# Patient Record
Sex: Male | Born: 1935 | Race: White | Hispanic: No | Marital: Single
Health system: Southern US, Community
[De-identification: ages and names within clinical notes are randomized; demographics above are authoritative.]

---

## 2004-04-05 ENCOUNTER — Emergency Department (HOSPITAL_COMMUNITY): Admission: EM | Admit: 2004-04-05 | Discharge: 2004-04-05 | Payer: Self-pay | Admitting: Emergency Medicine

## 2007-05-24 ENCOUNTER — Encounter (INDEPENDENT_AMBULATORY_CARE_PROVIDER_SITE_OTHER): Payer: Self-pay | Admitting: Orthopedic Surgery

## 2007-05-24 ENCOUNTER — Inpatient Hospital Stay (HOSPITAL_COMMUNITY): Admission: EM | Admit: 2007-05-24 | Discharge: 2007-05-30 | Payer: Self-pay | Admitting: Emergency Medicine

## 2007-05-27 ENCOUNTER — Ambulatory Visit: Payer: Self-pay | Admitting: Physical Medicine & Rehabilitation

## 2008-07-03 IMAGING — CR DG CHEST 1V
1 series · 1 of 1 positions shown · non-contrast
Comparison: none

CLINICAL DATA: Fall.  Fracture.
CHEST ? 1 VIEW:

[view not recorded]
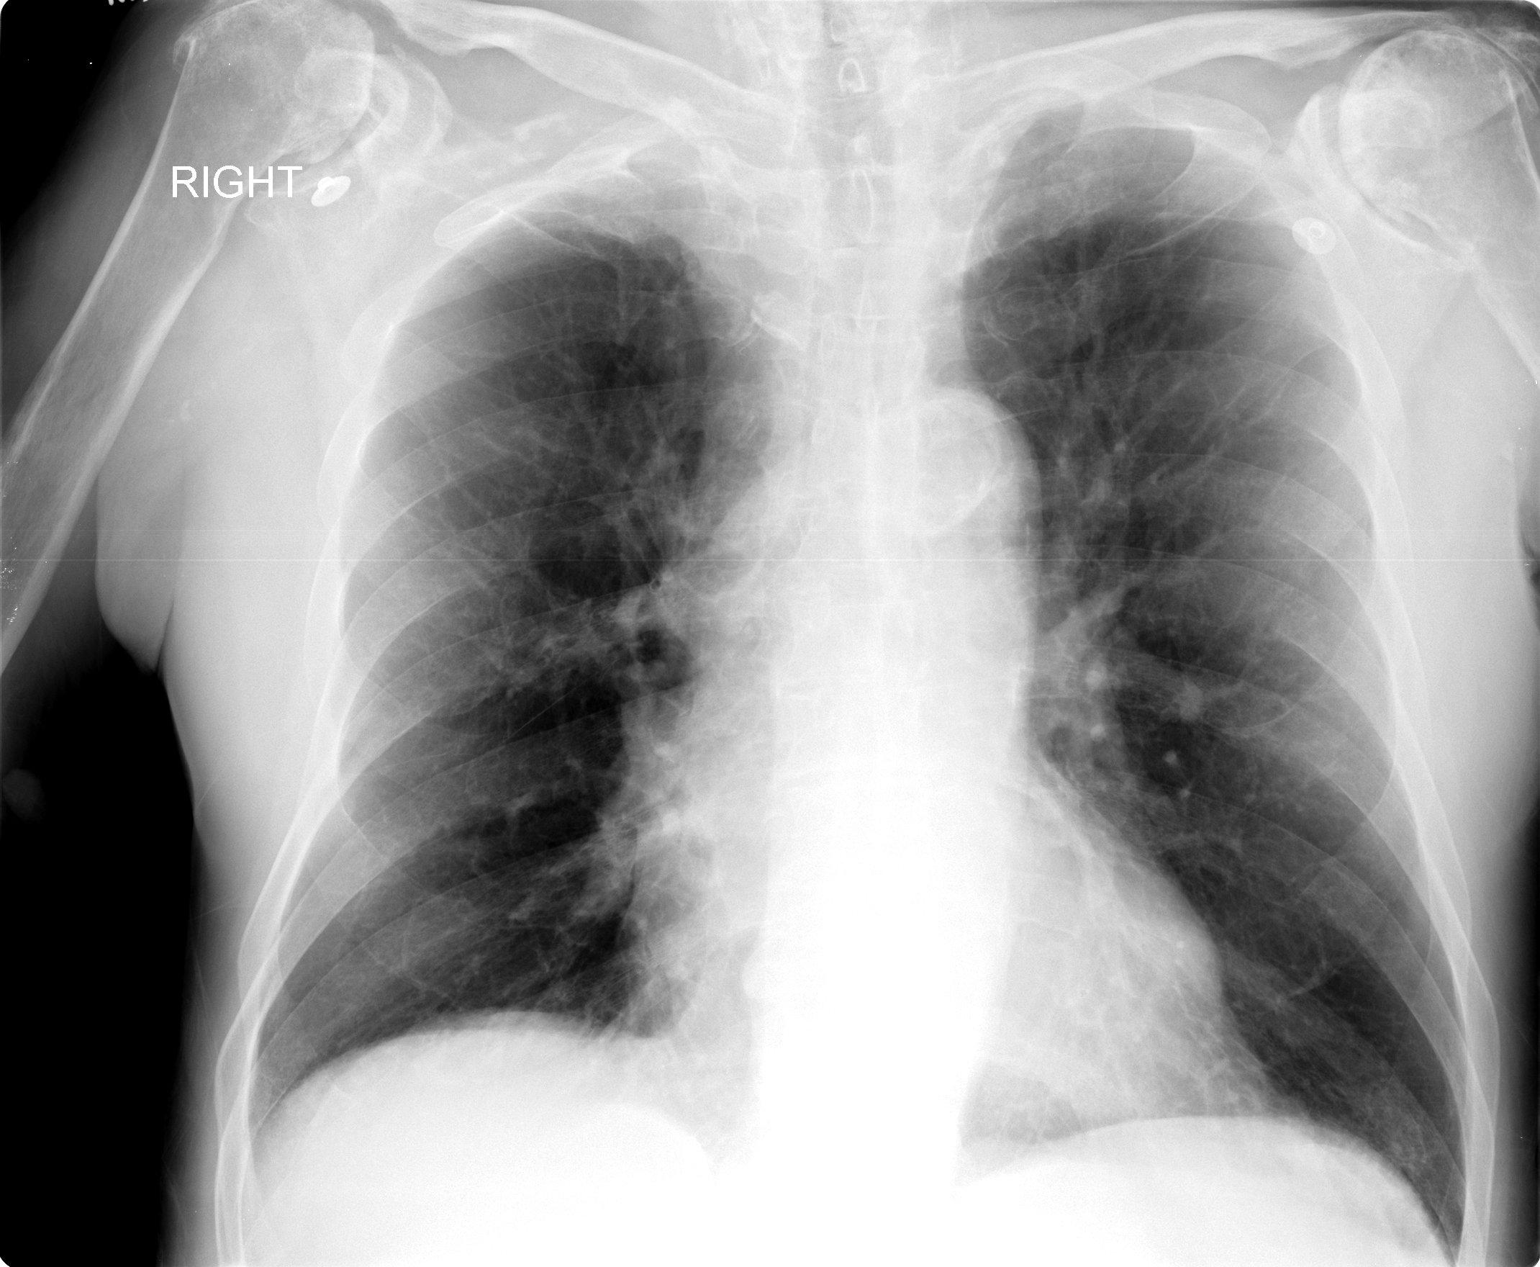

[1 of 1 positions shown; findings below may reference images not displayed]

FINDINGS: The chest is hyperexpanded but clear.  No effusion.  Mild cardiomegaly.  Severe degenerative disease about the shoulder is noted.
IMPRESSION: No acute disease with mild cardiomegaly noted.  
LEFT FEMUR - 2 VIEW:
FINDINGS: There is a periprosthetic fracture about the distal aspect of the femoral stem for right total hip replacement.  One shaft width medial displacement noted.  There is fragment override of approximately 3 cm.
IMPRESSION: Periprosthetic fracture as described.

## 2008-07-04 IMAGING — CR DG FEMUR 2V*L*
4 series · 4 of 4 positions shown · non-contrast
Comparison: 05/23/07.

CLINICAL DATA: Fracture fixation.
LEFT FEMUR ? 2 VIEW:

[view not recorded (1 of 4)]
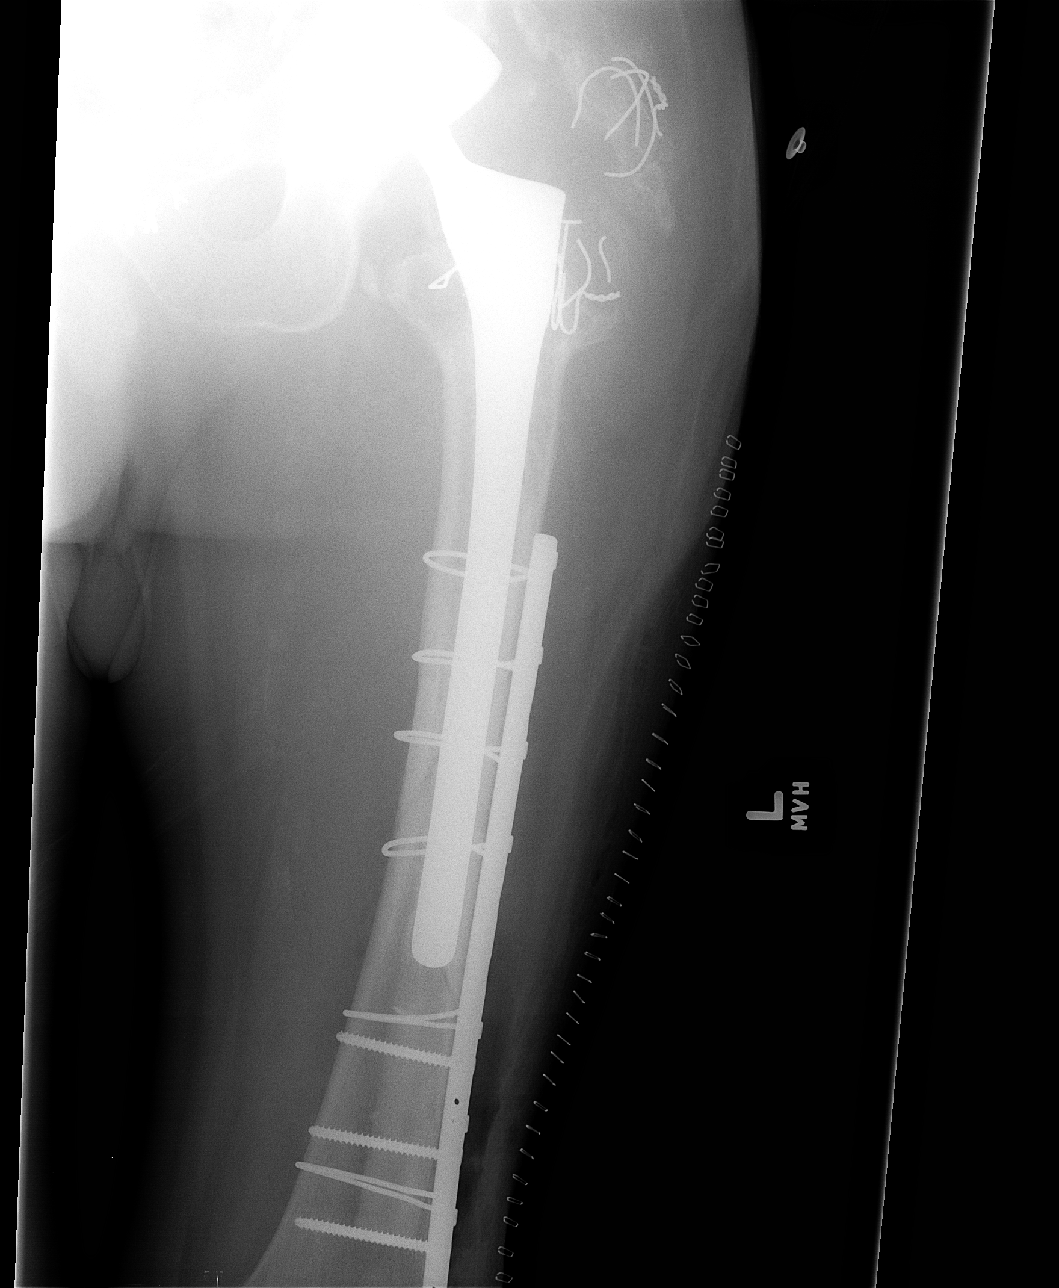

[view not recorded (2 of 4)]
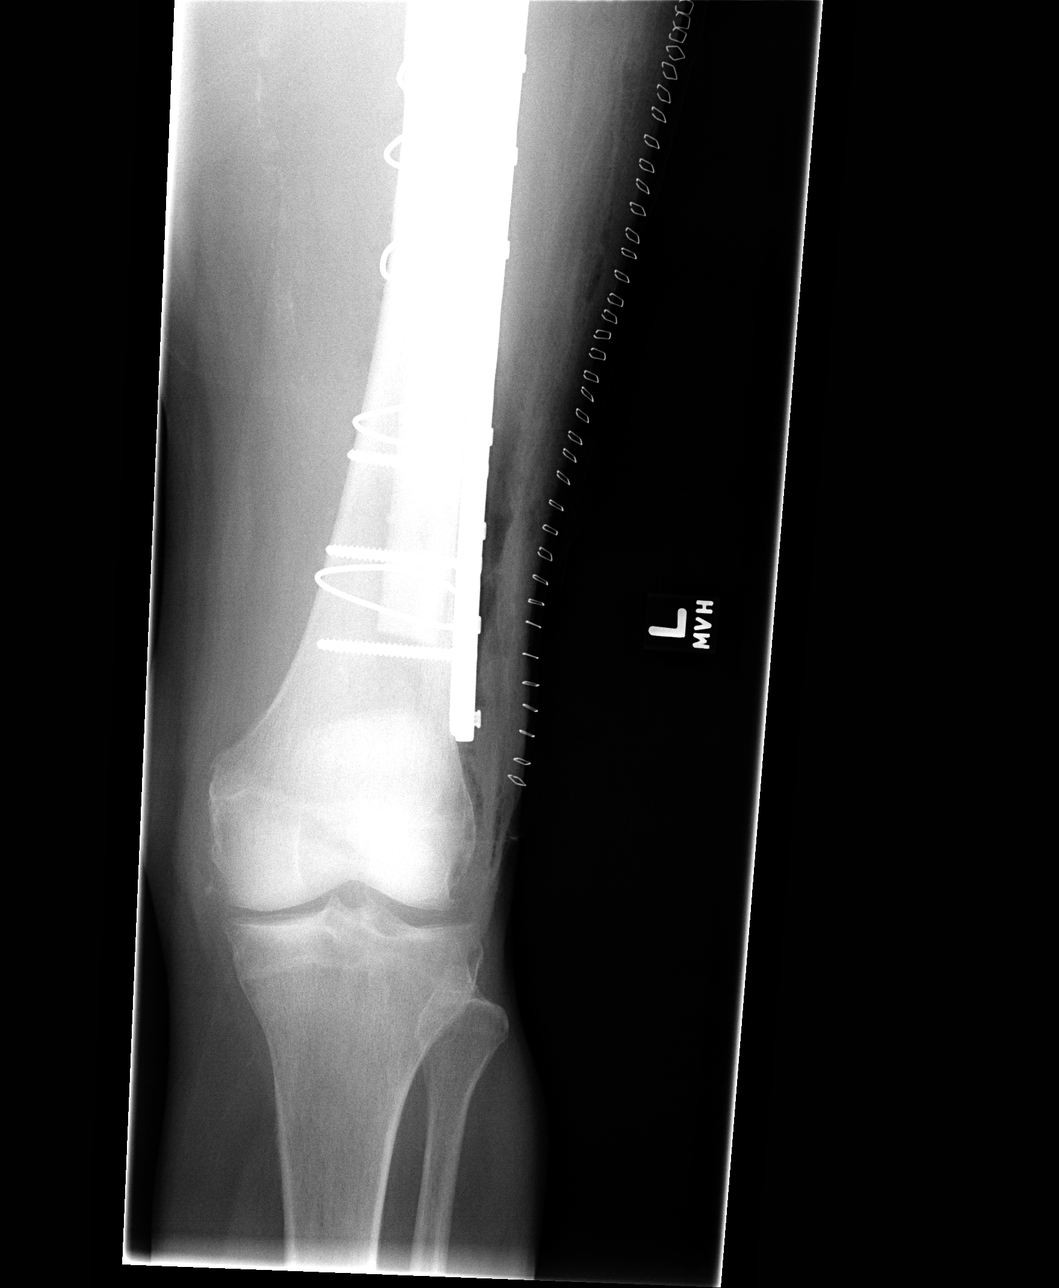

[view not recorded (3 of 4)]
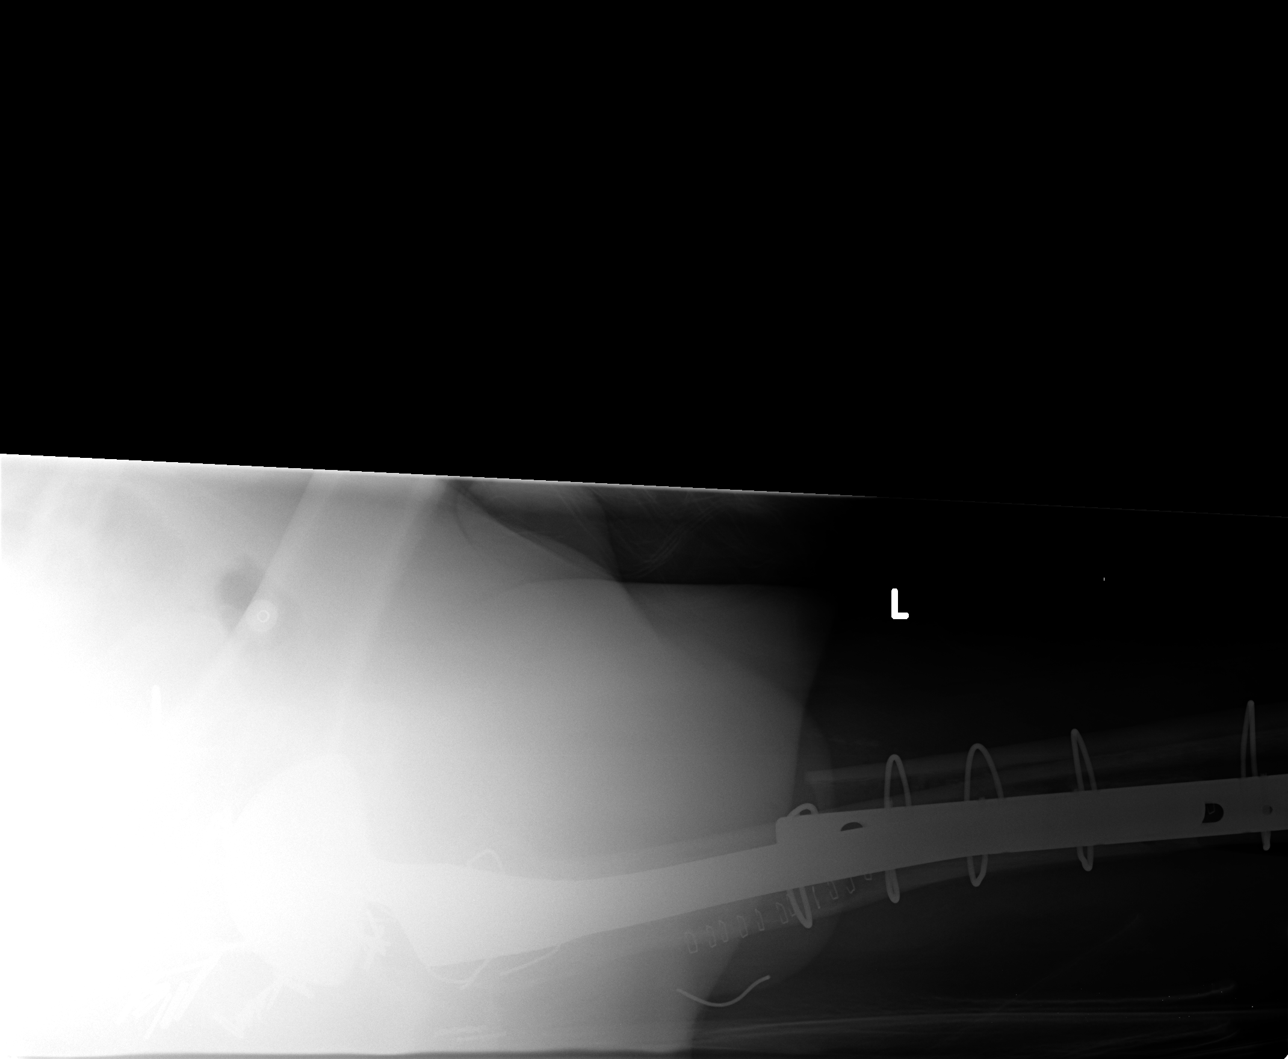

[view not recorded (4 of 4)]
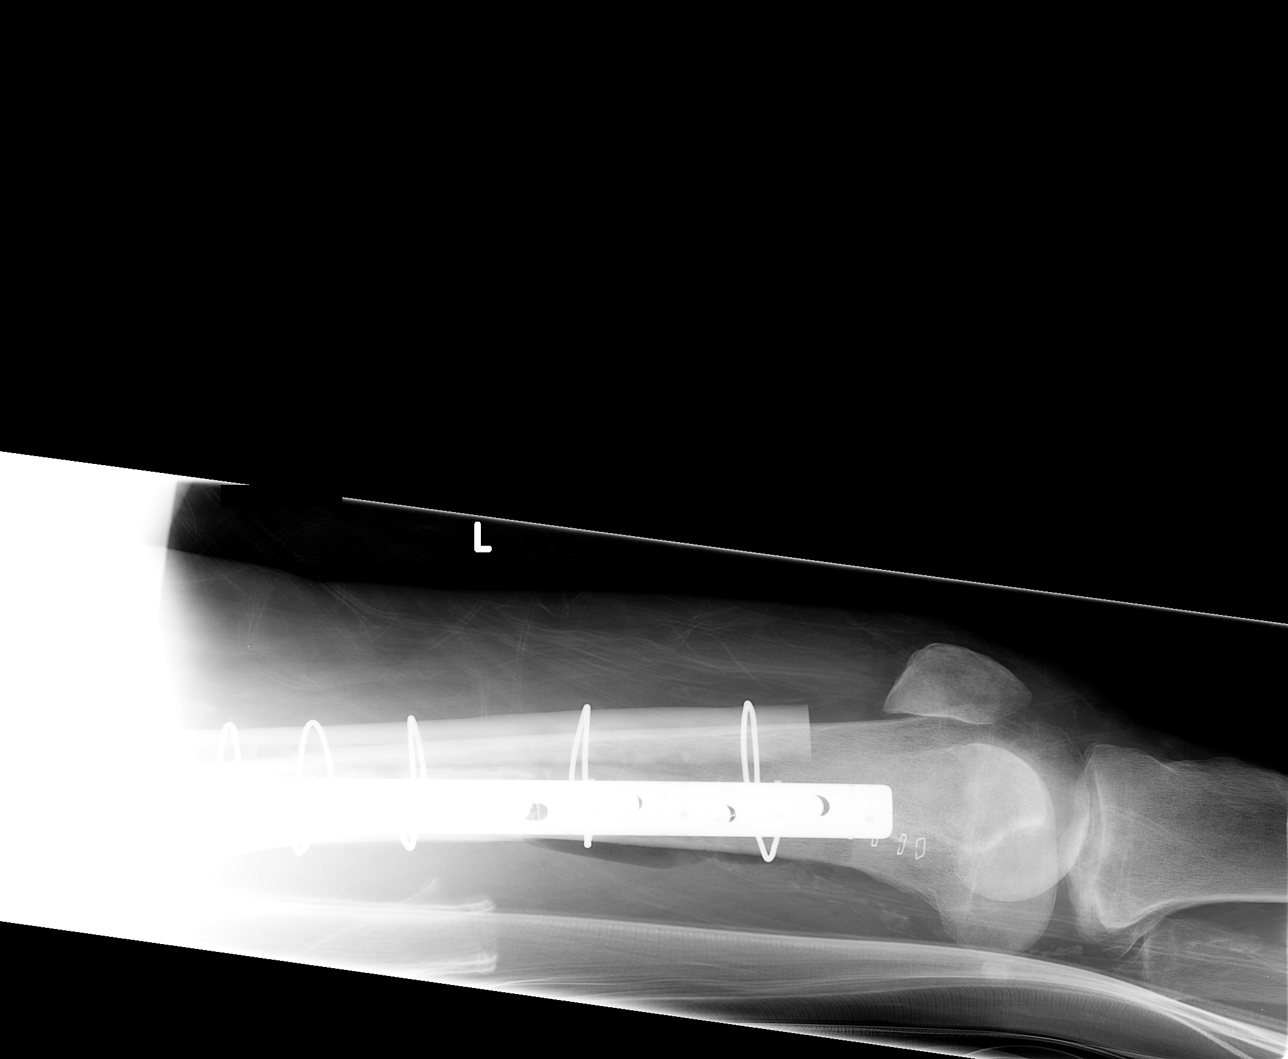

[4 of 4 positions shown; findings below may reference images not displayed]

FINDINGS: The patient has new plate and screws with Cerclage wires fixing a periprosthetic fracture in the left femur. Position and alignment are anatomic. Soft tissue gas is noted. Surgical staples are in place. No new abnormality.
IMPRESSION: Anatomic reduction of left periprosthetic femur fracture.

## 2010-10-14 NOTE — Op Note (Signed)
NAME:  Drew Kennedy, Drew Kennedy               ACCOUNT NO.:  [id number]   MEDICAL RECORD NO.:  [id number]          PATIENT TYPE:  INP   LOCATION:  1620                         FACILITY:  [facility]   PHYSICIAN:  Madlyn Frankel. Charlann Boxer, M.D.  DATE OF BIRTH:  [birth year]   DATE OF PROCEDURE:  05/24/2007  DATE OF DISCHARGE:                               OPERATIVE REPORT   PREOPERATIVE DIAGNOSIS:  Left periprosthetic femur fracture, closed.   POSTOPERATIVE DIAGNOSIS:  Left periprosthetic femur fracture, closed.   PROCEDURE:  Open reduction internal fixation of left periprosthetic  femur fracture utilizing allograft and a Zimmer 8-hole cable plate  system.   SURGEON:  Madlyn Frankel. Charlann Boxer, MD   ASSISTANT:  Yetta Glassman. Loreta Ave, PA   ANESTHESIA:  General.   BLOOD LOSS:  300.   COMPLICATIONS:  None.   DRAINS:  None.   INDICATIONS FOR PROCEDURE:  Mr. Mazon is a very pleasant 77 year old  gentleman here from [place] visiting his son when he missed a step on  stairs falling onto his left side.  He had immediate onset of pain,  inability to bear weight.  He was brought to [facility] through EMS,  initially admitted to Dr. Beverely Low and I was asked to help with  definitive management.  Risks and benefits of the procedure were  discussed with he and his family including the options available.  Based  on the spiral nature was fracture pattern I felt that open reduction  internal fixation would be best and this would probably classify as a  type B fracture.  Consent was obtained.   PROCEDURE IN DETAIL:  The patient was brought to the operative theater.  Once adequate anesthesia preoperative antibiotics, Ancef, administered  the patient was positioned in the right lateral decubitus position with  the left side up.  Left lower extremity was prepped and draped in a  sterile fashion.  I basically made an extensile approach to the femur at  this point, extending portions from his previous incision of the hip  down to  the knee.  The iliotibial band was opened up and the vastus  lateralis was elevated over the anterior aspect of the femur for  posterior intermuscular septum.  With the femur now exposing the  fracture identified there was noted to be muscle entrapment of the  fracture site and this was cleared.  Once the fracture was identified I  was able to reduce it into an anatomic position and hold it in place  with a clamp.  I then determined that it would be best fixed with an 8-  hole plate and a femoral allograft that we had previously chosen.  The  Zimmer 8-hole plate was opened and then positioned onto the lateral  aspect of the femur.  The allograft, which had thawed in saline, was  then opened and debrided of its sharp edges to get more of a flat  contoured service with a sharp bur.  While this was being prepared, I  did pass initially performed 4 wires, 2 proximal, 2 distal.   At this point I positioned the plate  in the final position with the  fracture reduced anatomically in addition to the now femoral strut which  matched almost in length probably 160 mm of length on the anterior  aspect of the femur.   With the fracture reduced I placed at this point 4 bicortical screws in  the distal femur.  I then placed 2 of my cables that had been pre-placed  around the strut allograft in the proximal aspect of fracture across the  prosthesis and into the plate and tensioned these down and held them in  place.  At this point with everything maintained I went ahead and  positioned 2 cables distally and 4 cables proximally, 3 of which were  around the strut allograft and plate and the fourth was just the most  proximal, was just around the femur itself.  With all the cables  tensioned to approximately 80 to 90 pounds of pressure and the screws  tightened down appropriately, the construct was complete, femur moved  without any signs of any excessive motion.  At this point I irrigated  the wound, that  was done throughout the case.  I reapproximated the  iliotibial band over the vastus fascia with the vastus lateralis over  top of the plate.  This was done in several running sutures.   The remainder of the wound was closed with 2-0 Vicryl and then staples  on the skin.  Skin was cleaned, dried and dressed sterilely with a bulky  sterile dressing.  He was brought to recovery room extubated and in  stable condition.      Madlyn Frankel Charlann Boxer, M.D.  Electronically Signed     MDO/MEDQ  D:  05/27/2007  T:  05/27/2007  Job:  [id number]

## 2010-10-14 NOTE — Op Note (Signed)
NAME:  Drew Kennedy, Drew Kennedy NO.:  [id number]   MEDICAL RECORD NO.:  [id number]          PATIENT TYPE:  INP   LOCATION:  1620                         FACILITY:  [facility]   PHYSICIAN:  Altha Harm, MDDATE OF BIRTH:  1918/09/28   DATE OF PROCEDURE:  05/24/2007  DATE OF DISCHARGE:                               OPERATIVE REPORT   REASON FOR CONSULTATION:  Preoperative evaluation for noncardiac  surgery.   HISTORY OF PRESENT ILLNESS:  This gentleman is an 77 year old gentleman  who recently drove from [place] to spend the Christmas holidays with his  son.  He states that while walking up the stairs he missed a step and  fell and sustained a fracture to the left hip.  The patient states that  he had no dizziness, loss of consciousness, seizure activity, or no  syncope prior to the fall.  Essentially the fall resulted from tripping  over a step.  The patient does have a history of hypertension and states  that it has been well controlled.  The patient is active at a community  level and states that he walks usually about approximately 3/4 to 1  block on a daily basis with the use of a cane.  The patient has no  significant cardiac history except for hypertension.  He states he has  not had a cardiac cath or a stress test in the past.  The patient denies  any chest pain at this time or in the past.  He states that he has  recently been fully evaluated by his medical doctors in [place] at the  [facility] and was found to be in good health.  The  patient has had no fever or chills.  He has had no nausea, vomiting, or  diarrhea.   PAST MEDICAL HISTORY:  Significant for hypertension.  The patient has a  history of prostate cancer in the past, approximately 22 years ago,  status post prostatectomy.  The patient currently is not undergoing any  treatment for his prostate cancer.   FAMILY HISTORY:  Though remarkable for hypertension, it has been  significant in  a patient of this age.   SOCIAL HISTORY:  The patient lives alone and functions on a community  level.  He denies any tobacco use.  He drinks alcohol socially and there  is no drug use involved.  The patient has no significant family where he  lives in [place].  His son resides in [place] in [place], who  he is visiting at this time.  The patient's son has expressed that he  feels that his home is not suitable for the postoperative rehabilitation  of the patient and both the patient and son are requesting consideration  for skilled placement, post surgical for rehabilitation.  The patient  eventually was able to drive himself back to [place].   CURRENT MEDICATIONS:  Include lisinopril 40 mg p.o. daily.   ALLERGIES:  NO KNOWN DRUG ALLERGIES.   REVIEW OF SYSTEMS:  Twelve systems are reviewed.  All systems are  negative except as noted in the HPI.  PHYSICAL EXAMINATION:  The patient is laying in bed, alert and oriented  x3.  In no acute distress.  His son is by the bedside.  He is a well-  nourished, well-developed gentleman who appears robust and not frail in  appearance.  Temperature 98.2.  Heart rate 76.  Respiratory rate 16.  Blood pressure 157/62.  O2 sats 100% on room air.  HEENT:  He is normocephalic, atraumatic.  Pupils are equally round and  reactive to light and accommodation.  Extraocular movements are intact.  There is no icterus or subconjunctival pallor.  Oropharynx is moist.  No  exudate, erythema, lesions are noted.  NECK:  Trachea is midline.  No masses, no thyromegaly.  No JVD, no  carotid bruit.  LUNGS:  Clear to auscultation.  No wheezing or rhonchi noted.  CARDIOVASCULAR:  He has got a normal S1 and S2.  There is a 2/6 systolic  ejection measurement at the base without radiation.  No heaves or  thrills on palpation.  ABDOMEN:  Obese, soft, nontender, nondistended.  No hepatosplenomegaly  noted.  LYMPH NODE:  There is no cervical, axillary, or inguinal  lymph nodes  noted.  MUSCULOSKELETAL:  The patient has left lower extremity that is somewhat  externally rotated and shortened.  Otherwise she has got good range of  motion in the right lower extremity and bilateral upper extremities.  There is no warmth, swelling, or erythema around the joints noted.  The  patient has no spinal tenderness.  He has a mildly kyphotic posture  without any apparent respiratory compromise.  NEUROLOGIC:  Cranial nerves 2-12 are grossly intact.  There is no focal  neurological deficit.  DTRs are 2+ bilaterally, upper and lower  extremities.  Strength is 4+/5 in bilateral upper extremities, 4/5 in  the right lower extremity.  Left lower extremity strength is not tested  secondary to fracture.  Please note that the patient has good pedal and  dorsalis pedis pulses.   LABORATORY STUDIES:  White blood cell count 8.5, hemoglobin 11.1,  hematocrit 32.6, platelet count 251.  Sodium 130, and potassium 3.8.  Chloride 106.  Bicarb 25.  BUN 20.  Creatinine 0.97.  INR 1.0.  Protime  13.3.  PTT 31.   ASSESSMENT AND PLAN:  This is a patient who is in genuinely good health  and presents with:  1. Left hip fracture.  2. Mild anemia.  3. Systolic ejection murmur.   The patient, given his history and level of functioning, is a low risk  for perioperative complications.  The only confounding factor is the  murmur heard on examination, which should be evaluated . It certainly  appears to be a murmur consistent with sclerosis.  However, a 2D  echocardiogram will be ordered to rule out stenosis.  None of this  should prevent the patient from proceeding with his surgery and we will  follow along with this.  In terms of the anemia, the patient likely has  a chronic anemia.  Unless the patient has a significant drop in  hemoglobin, at this time I see no reason to perform transfusion.  The  patient is here on an acute basis and has a medical home.  He states  that he has had  workup for his anemia and I will defer that to his  primary care physician as acutely the patient has no urgent need for a  workup.      Altha Harm, MD  Electronically Signed  MAM/MEDQ  D:  05/25/2007  T:  05/25/2007  Job:  [id number]

## 2010-10-14 NOTE — Discharge Summary (Signed)
NAME:  HAYZE, GAZDA               ACCOUNT NO.:  [id number]   MEDICAL RECORD NO.:  [id number]          PATIENT TYPE:  INP   LOCATION:  1620                         FACILITY:  [facility]   PHYSICIAN:  Madlyn Frankel. Charlann Boxer, M.D.  DATE OF BIRTH:  06-20-1918   DATE OF ADMISSION:  05/23/2007  DATE OF DISCHARGE:  05/30/2007                               DISCHARGE SUMMARY   ADMITTING DIAGNOSES:  1. Osteopenia with history of left total hip replacement in 1981.  2. Hypertension.  3. Prostate cancer.  4. Rheumatic fever in childhood.  5. History of right total knee replacement in 2007.   DISCHARGE DIAGNOSES:  1. Osteopenia with periprosthetic fracture.  2. Hypertension.  3. Prostate cancer.  4. Rheumatic fever as a child.  5. History of right total knee replacement.   CONSULTANTS:  Rehabilitation for in hospital rehabilitation, [facility]  team.   HISTORY OF PRESENT ILLNESS:  An 77 year old male who fell on the 21st.  Was transported to emergency department where evaluation revealed by x-  ray a left leg periprosthetic fracture.  Denies any chest pain,  shortness of breath, syncope prior to follow-up.   LABORATORIES ON ADMISSION:  Hematocrit 32.6.  CMET normal.  INR 1.  At  time of discharge hematocrit 31.8, CMET all within normal limits, no  significant trends.   RADIOLOGY:  Chest one view:  No acute disease with mild cardiomegaly.  Left femur showed periprosthetic fracture.  Left femur  postoperatively  showed reduction of left periprosthetic femur.   Cardiology EKG:  Normal sinus rhythm.   HOSPITAL COURSE:  The patient admitted through the emergency department  to our orthopedic service.  Care was transferred from  Dr. Ranell Patrick to Dr. Charlann Boxer for definitive surgical management.  He was  cleared medically for surgery on the 22nd by [facility] team.  He was  cleared medically for surgery on the 23rd.  He underwent open reduction.  left periprosthetic femur fracture with allograft placement on  the 23rd.  He tolerated procedure well, had no complications postoperatively.  He  was afebrile throughout his course of stay.  His dressing was changed on  a daily basis with no significant wound drainage.  He was  neurovascularly intact to his left lower extremity.  PT, OT was started  postop day #1 with touchdown weightbearing with no twisting or pivoting.  DVT prophylaxis Lovenox was started on postop day #1.  We did request a  [facility] rehabilitation consult; lack of medical necessity for [facility]  rehabilitation.  Did well with physical therapy; medically remained  stable.  Was able to ambulate 32 feet with physical therapy.  On the  29th he was stable and ready for discharge.   DISCHARGE DISPOSITION:  Stable and improved condition.   DISCHARGE PHYSICAL THERAPY:  He is touchdown weightbearing only for six  weeks.  He will require the use of a rolling walker.  Want to continue  to work on balance, gait training, proprioception.   DISCHARGE DIET:  Regular as tolerated by the patient.   DISCHARGE MEDICATIONS:  1. Lisinopril 40 mg p.o. daily.  2. Lipitor 40  mg, has not been taking.  3. Vitamins A, B, C, D, E p.o. daily.  4. Lovenox 40 mg subcu q.24 x8 days.  5. Enteric-coated aspirin 325 mg one p.o. daily x4 weeks after Lovenox      completed.  6. Robaxin 500 mg p.o. q.6 muscle spasm pain p.r.n.  7. Vicodin 5/325 1-2 p.o. q.4-6 p.r.n. pain.  8. Iron 325 mg one p.o. t.i.d. x10 days.  9. Colace 100 mg p.o. b.i.d. p.r.n. constipation.  10.MiraLax 17 g p.o. daily p.r.n. constipation.   DISCHARGE FOLLOWUP:  To follow up with Dr. Charlann Boxer at phone number [phone]  in 10-14 days.   PROCEDURES:  Was an open reduction internal fixation of left  periprosthetic fracture with allograft.  The surgeon was Dr. Durene Romans.  Assistant was [employer] PA.     ______________________________  Yetta Glassman. Loreta Ave, [place]      Madlyn Frankel. Charlann Boxer, M.D.  Electronically Signed    BLM/MEDQ  D:  05/30/2007  T:   05/30/2007  Job:  [id number]

## 2011-03-06 LAB — CBC
HCT: 25.5 — ABNORMAL LOW
HCT: 28 — ABNORMAL LOW
HCT: 31.8 — ABNORMAL LOW
HCT: 32.6 — ABNORMAL LOW
Hemoglobin: 10.9 — ABNORMAL LOW
Hemoglobin: 11.1 — ABNORMAL LOW
Hemoglobin: 8.8 — ABNORMAL LOW
Hemoglobin: 9.7 — ABNORMAL LOW
MCHC: 34.1
MCHC: 34.3
MCHC: 34.5
MCHC: 34.6
MCV: 84.8
MCV: 84.9
MCV: 85.1
MCV: 85.3
Platelets: 180
Platelets: 192
Platelets: 208
Platelets: 251
RBC: 3 — ABNORMAL LOW
RBC: 3.3 — ABNORMAL LOW
RBC: 3.73 — ABNORMAL LOW
RBC: 3.83 — ABNORMAL LOW
RDW: 13.1
RDW: 14.5
RDW: 14.5
RDW: 14.7
WBC: 10
WBC: 11 — ABNORMAL HIGH
WBC: 8.5
WBC: 9

## 2011-03-06 LAB — BASIC METABOLIC PANEL
BUN: 20
BUN: 8
BUN: 9
CO2: 24
CO2: 25
CO2: 25
Calcium: 7.9 — ABNORMAL LOW
Calcium: 8.2 — ABNORMAL LOW
Calcium: 9
Chloride: 106
Chloride: 109
Chloride: 109
Creatinine, Ser: 0.84
Creatinine, Ser: 0.91
Creatinine, Ser: 0.97
GFR calc Af Amer: >60
GFR calc Af Amer: >60
GFR calc Af Amer: >60
GFR calc non Af Amer: >60
GFR calc non Af Amer: >60
GFR calc non Af Amer: >60
Glucose, Bld: 107 — ABNORMAL HIGH
Glucose, Bld: 128 — ABNORMAL HIGH
Glucose, Bld: 143 — ABNORMAL HIGH
Potassium: 3.8
Potassium: 4.1
Potassium: 4.2
Sodium: 137
Sodium: 138
Sodium: 139

## 2011-03-06 LAB — CROSSMATCH
ABO/RH(D): A NEG
Antibody Screen: NEGATIVE

## 2011-03-06 LAB — DIFFERENTIAL
Basophils Absolute: 0.1
Basophils Relative: 1
Eosinophils Absolute: 0.3
Eosinophils Relative: 3
Lymphocytes Relative: 27
Lymphs Abs: 2.3
Monocytes Absolute: 0.5
Monocytes Relative: 6
Neutro Abs: 5.3
Neutrophils Relative %: 63

## 2011-03-06 LAB — HEMOGLOBIN AND HEMATOCRIT, BLOOD
HCT: 30.9 — ABNORMAL LOW
Hemoglobin: 10.5 — ABNORMAL LOW

## 2011-03-06 LAB — APTT: aPTT: 31

## 2011-03-06 LAB — SAMPLE TO BLOOD BANK

## 2011-03-06 LAB — ABO/RH: ABO/RH(D): A NEG

## 2011-03-06 LAB — PROTIME-INR
INR: 1
Prothrombin Time: 13.3

## 2015-10-09 NOTE — [facility] Notes (Signed)
This encounter was created in error - please disregard.
